# Patient Record
Sex: Female | Born: 1970 | Hispanic: No | Marital: Married | Smoking: Never smoker
Health system: Southern US, Community
[De-identification: ages and names within clinical notes are randomized; demographics above are authoritative.]

## PROBLEM LIST (undated history)

## (undated) HISTORY — PX: FINGER SURGERY: SHX640

---

## 2014-02-12 ENCOUNTER — Encounter (HOSPITAL_COMMUNITY): Payer: Self-pay | Admitting: *Deleted

## 2014-02-12 ENCOUNTER — Emergency Department (HOSPITAL_COMMUNITY)
Admission: EM | Admit: 2014-02-12 | Discharge: 2014-02-13 | Disposition: A | Discharge status: Home / Self Care | Payer: Federal, State, Local not specified - PPO | Attending: Emergency Medicine | Admitting: Emergency Medicine

## 2014-02-12 DIAGNOSIS — K6289 Other specified diseases of anus and rectum: Secondary | ICD-10-CM | POA: Diagnosis present

## 2014-02-12 DIAGNOSIS — Z79899 Other long term (current) drug therapy: Secondary | ICD-10-CM | POA: Insufficient documentation

## 2014-02-12 DIAGNOSIS — R112 Nausea with vomiting, unspecified: Secondary | ICD-10-CM | POA: Diagnosis not present

## 2014-02-12 DIAGNOSIS — K5900 Constipation, unspecified: Secondary | ICD-10-CM | POA: Diagnosis not present

## 2014-02-12 NOTE — ED Notes (Signed)
Pt states that she has taken milk of Magnesia, tea and some over the counter medications to have a BM and has not been successful.

## 2014-02-12 NOTE — ED Notes (Signed)
PT states that she is having rectal pain / pressure; pt states that she feels like she she needs to have a BM but is unable to go; pt last BM was Tues and normal per pt; pt also c/o lower abd cramping

## 2014-02-13 DIAGNOSIS — R112 Nausea with vomiting, unspecified: Secondary | ICD-10-CM | POA: Diagnosis not present

## 2014-02-13 DIAGNOSIS — Z79899 Other long term (current) drug therapy: Secondary | ICD-10-CM | POA: Diagnosis not present

## 2014-02-13 DIAGNOSIS — K5900 Constipation, unspecified: Secondary | ICD-10-CM | POA: Diagnosis not present

## 2014-02-13 DIAGNOSIS — K6289 Other specified diseases of anus and rectum: Secondary | ICD-10-CM | POA: Diagnosis present

## 2014-02-13 MED ORDER — POLYETHYLENE GLYCOL 3350 17 G PO PACK: 17.0000 g | PACK | Freq: Every day | ORAL | Status: AC

## 2014-02-13 MED ORDER — ONDANSETRON 4 MG PO TBDP
4.0000 mg | ORAL_TABLET | Freq: Once | ORAL | Status: AC
  Administered 2014-02-13: 4 mg via ORAL
  Filled 2014-02-13: qty 1

## 2014-02-13 MED ORDER — ONDANSETRON HCL 4 MG PO TABS: 4.0000 mg | ORAL_TABLET | Freq: Four times a day (QID) | ORAL | Status: AC

## 2014-02-13 NOTE — ED Notes (Signed)
Pt reports rectal pain from feeling constipated. Pt reports her rectum is still sore but she recently had a bowel movement.

## 2014-02-13 NOTE — ED Provider Notes (Signed)
CSN: [id number]     Arrival date & time 02/12/14  2312 History   First MD Initiated Contact with Patient 02/13/14 0048     Chief Complaint  Patient presents with  . Rectal Pain     (Consider location/radiation/quality/duration/timing/severity/associated sxs/prior Treatment) Patient is a 44 y.o. female presenting with constipation. The history is provided by the patient. No language interpreter was used.  Constipation Severity:  Moderate Timing:  Constant Associated symptoms: abdominal pain, nausea and vomiting   Associated symptoms: no fever   Associated symptoms comment:  She has not moved her bowels for the past 2 days which is unusual for her normal bowel habit. No rectal bleeding, no vomiting. Prior to arrival she took a laxative, MOM, and attempted an enema. Since arrival she has had a large bowel movement, initially hard followed by non-melanic loose stool, associated with abdominal cramping that is causing nausea with vomiting. No fever. No urinary symptoms. She reports several dietary changes in the recent past that she feels may have contributed to constipation.    History reviewed. No pertinent past medical history. History reviewed. No pertinent past surgical history. No family history on file. History  Substance Use Topics  . Smoking status: Never Smoker   . Smokeless tobacco: Not on file  . Alcohol Use: Yes     Comment: socially   OB History    No data available     Review of Systems  Constitutional: Negative for fever and chills.  HENT: Negative.   Respiratory: Negative.   Cardiovascular: Negative.   Gastrointestinal: Positive for nausea, vomiting, abdominal pain and constipation. Negative for blood in stool.  Musculoskeletal: Negative.   Skin: Negative.   Neurological: Negative.       Allergies  Review of patient's allergies indicates no known allergies.  Home Medications   Prior to Admission medications   Medication Sig Start Date End Date Taking?  Authorizing Provider  carisoprodol (SOMA) 350 MG tablet Take 1 tablet by mouth every 6 (six) hours as needed. Muscle spasm 12/17/13  Yes Historical Provider, MD  magnesium hydroxide (MILK OF MAGNESIA) 400 MG/5ML suspension Take 30 mLs by mouth daily as needed for mild constipation.   Yes Historical Provider, MD  OVER THE COUNTER MEDICATION Take 1 packet by mouth daily as needed (constipation). OTC tea   Yes Historical Provider, MD  phentermine (ADIPEX-P) 37.5 MG tablet Take 37.5 mg by mouth every morning. 01/02/14  Yes Historical Provider, MD  senna (SENOKOT) 8.6 MG tablet Take 2 tablets by mouth daily as needed for constipation.   Yes Historical Provider, MD   BP 131/98 mmHg  Pulse 100  Temp(Src) 98.3 F (36.8 C) (Oral)  Resp 18  SpO2 94%  LMP 02/08/2014 Physical Exam  Constitutional: She is oriented to person, place, and time. She appears well-developed and well-nourished. No distress.  Patient standing, uncomfortable lying.   Cardiovascular: Normal rate.   Pulmonary/Chest: Effort normal.  Abdominal: Soft. There is no tenderness.  Genitourinary:  Rectal exam declined by patient.  Musculoskeletal: Normal range of motion.  Neurological: She is alert and oriented to person, place, and time.  Skin: Skin is warm and dry.  Psychiatric: She has a normal mood and affect.    ED Course  Procedures (including critical care time) Labs Review Labs Reviewed - No data to display  Imaging Review No results found.   EKG Interpretation None      MDM   Final diagnoses:  None    1. Constipation, resolved 2.  Rectal pain  The patient has had multiple bowel movements while in the ED. She feels improved, with periods abdominal cramping that is easing. Abdomen soft, non-tender. Patient has declined further exam and states she is ready for discharge. Discussed regular dietary considerations to avoid constipation in the future.    Arnoldo Hooker, PA-C 02/13/14 1610  Vanetta Mulders,  MD 02/18/14 [phone]

## 2014-02-13 NOTE — Discharge Instructions (Signed)
RECOMMEND DAILY FIBER SUPPLEMENT - ie BENAFIBER, METAMUCIL ZOFRAN FOR NAUSEA AS NEEDED TRY TUCKS MEDICATED PADS AND/OR PREPARATION H FOR RECTAL PAIN ALSO RECOMMEND SITZ BATHS RETURN TO THE ED WITH ANY FEVER, SEVERE PAIN OR NEW CONCERN   Constipation Constipation is when a person:  Poops (has a bowel movement) less than 3 times a week.  Has a hard time pooping.  Has poop that is dry, hard, or bigger than normal. HOME CARE   Eat foods with a lot of fiber in them. This includes fruits, vegetables, beans, and whole grains such as brown rice.  Avoid fatty foods and foods with a lot of sugar. This includes french fries, hamburgers, cookies, candy, and soda.  If you are not getting enough fiber from food, take products with added fiber in them (supplements).  Drink enough fluid to keep your pee (urine) clear or pale yellow.  Exercise on a regular basis, or as told by your doctor.  Go to the restroom when you feel like you need to poop. Do not hold it.  Only take medicine as told by your doctor. Do not take medicines that help you poop (laxatives) without talking to your doctor first. GET HELP RIGHT AWAY IF:   You have bright red blood in your poop (stool).  Your constipation lasts more than 4 days or gets worse.  You have belly (abdominal) or butt (rectal) pain.  You have thin poop (as thin as a pencil).  You lose weight, and it cannot be explained. MAKE SURE YOU:   Understand these instructions.  Will watch your condition.  Will get help right away if you are not doing well or get worse. Document Released: 07/19/2007 Document Revised: 02/04/2013 Document Reviewed: 11/11/2012 [facility] Patient Information 2015 [place], [place]. This information is not intended to replace advice given to you by your health care provider. Make sure you discuss any questions you have with your health care provider.  Anal Fissure, Adult An anal fissure is a small tear or crack in the skin  around the opening of the butt (anus).Bleeding from the tear or crack usually stops on its own within a few minutes. The bleeding may happen every time you poop until the tear or crack heals. HOME CARE  Eat lots of fruit, whole grains, and vegetables. Avoid foods like bananas and dairy products. These foods can make it hard to poop.  Take a warm water bath (sitz bath) as told by your doctor.  Drink enough fluids to keep your pee (urine) clear or pale yellow.  Only take medicines as told by your doctor. Do not take aspirin.  Do not use numbing creams or hydrocortisone cream on the area. These creams can slow healing. GET HELP RIGHT AWAY IF:  Your tear or crack is not healed in 3 days.  You have more bleeding.  You have a fever.  You have watery poop (diarrhea) mixed with blood.  You have pain.  You are getting worse, not better. MAKE SURE YOU:   Understand these instructions.  Will watch your condition.  Will get help right away if you are not doing well or get worse. Document Released: 09/28/2010 Document Revised: 04/24/2011 Document Reviewed: 09/28/2010 [facility] Patient Information 2015 [place], [place]. This information is not intended to replace advice given to you by your health care provider. Make sure you discuss any questions you have with your health care provider.

## 2014-02-24 ENCOUNTER — Encounter: Payer: Federal, State, Local not specified - PPO | Attending: Obstetrics | Admitting: Dietician

## 2014-02-24 ENCOUNTER — Encounter: Payer: Self-pay | Admitting: Dietician

## 2014-02-24 VITALS — Ht 64.5 in | Wt 215.0 lb

## 2014-02-24 DIAGNOSIS — Z6836 Body mass index (BMI) 36.0-36.9, adult: Secondary | ICD-10-CM | POA: Diagnosis not present

## 2014-02-24 DIAGNOSIS — Z713 Dietary counseling and surveillance: Secondary | ICD-10-CM | POA: Diagnosis not present

## 2014-02-24 DIAGNOSIS — E669 Obesity, unspecified: Secondary | ICD-10-CM | POA: Insufficient documentation

## 2014-02-24 NOTE — Progress Notes (Signed)
Medical Nutrition Therapy:  Appt start time: 0840 end time:  0940.   Assessment:  Primary concerns today: Holly Randolph is here today is here today since she is trying to lose weight. Having trouble losing weight and has tried a lot of diets including a vegan diet. Has been exercising and hurt her heel so she has but back on the exercise. Likes to walk and go spinning. Feel frustrating that weight is not coming off. Is aware of her portion sizes. Started taking phentermine about 3-4 weeks and lost 5 lbs. Taking it every other day. Does not feel good while taking it. Has no appetite and has an "achy" feeling.  Has always been "heavier" but not obese and in her 20's had a child and then lost about 30 lbs with a lot of exercise. Gained weight back in her 30's and got to 200 lbs and is now at 215 lbs. Highest weight was 227 lbs in 2014.  Lives with her mother. She does the grocery shopping and hired a Investment banker, operational to do the cooking once per week and preps meals for the week. The chef can cook any cooking style she wants. 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   In a given week will miss 10-12 meals. Not eating out as much as before, about 1-2 x week. Eating once per day lately on phentermine. Only getting 5 hours on phentermine, though will get 7-8 hours not on phentermine.  Has been struggling with constipation and trying to eat more fiber.   Preferred Learning Style:   No preference indicated   Learning Readiness:   Ready  MEDICATIONS: phentermine   DIETARY INTAKE:  Usual eating pattern includes 1-2 meals and 1 snacks per day.  Avoided foods include some seafood, liver, cheese  24-hr recall:  B (9-10 AM): coffee with creamer and sugar Snk ( AM): fruit every once in awhile L (3-4 PM): bagel with cream cheese or soup or raisin bran with almond milk or 1% Snk ( PM): none D (5-6PM): lentil loaf, beef stroganoff, ham [place], risotto with vegetables   Snk ( PM): chips,  dessert Beverages: water, more rarely beer or wine, diet sprite sometimes  Usual physical activity: none right now d/t food injury (plantar fasciitis), planning to get back to exercise  Estimated energy needs: 1800 calories 200 g carbohydrates 135 g protein 50 g fat  Progress Towards Goal(s):  In progress.   Nutritional Diagnosis:  [place]-3.3 Overweight/obesity As related to meal skipping and large portion sizes .  As evidenced by diet recall and BMI of 36.3    Intervention:  Nutrition counseling provided. Plan: Aim to eat 3 meals per day and snacks if you are hungry. Have protein with carbohydrates for snacks.  For breakfast, try having a snack or a protein shake (Premier) with fruit, toast, or another carbohydrate. If you want to do a smoothie, have about 1 cup of fruit with vegetables and protein (powder, plain yogurt, or nuts). To add more fiber, have a lot of vegetables, fruits, whole grain (brown rice, whole wheat pasta and bread, grains), and beans. Try to use smaller plates to help with portions. Measure out snacks. Try to eat meals undistracted and take about 20 minutes to eat (chewing about 20 x bite).  Pay attention to hunger/fullness cues.  Plan to start exercising 2-3 x week at the gym.   Teaching Method Utilized:  Visual Auditory Hands on  Handouts given during visit include:  MyPlate Handout  16X15g CHO Snacks  Barriers to learning/adherence to lifestyle change: unstructured schedule   Demonstrated degree of understanding via:  Teach Back   Monitoring/Evaluation:  Dietary intake, exercise, and body weight in 1 week(s).

## 2014-02-24 NOTE — Patient Instructions (Signed)
Aim to eat 3 meals per day and snacks if you are hungry. Have protein with carbohydrates for snacks.  For breakfast, try having a snack or a protein shake (Premier) with fruit, toast, or another carbohydrate. If you want to do a smoothie, have about 1 cup of fruit with vegetables and protein (powder, plain yogurt, or nuts). To add more fiber, have a lot of vegetables, fruits, whole grain (brown rice, whole wheat pasta and bread, grains), and beans. Try to use smaller plates to help with portions. Measure out snacks. Try to eat meals undistracted and take about 20 minutes to eat (chewing about 20 x bite).  Pay attention to hunger/fullness cues.  Plan to start exercising 2-3 x week at the gym.

## 2014-03-31 ENCOUNTER — Ambulatory Visit: Payer: Federal, State, Local not specified - PPO | Admitting: Dietician

## 2016-10-08 ENCOUNTER — Emergency Department (HOSPITAL_COMMUNITY): Payer: Federal, State, Local not specified - PPO

## 2016-10-08 ENCOUNTER — Encounter (HOSPITAL_COMMUNITY): Payer: Self-pay | Admitting: Emergency Medicine

## 2016-10-08 ENCOUNTER — Emergency Department (HOSPITAL_COMMUNITY)
Admission: EM | Admit: 2016-10-08 | Discharge: 2016-10-08 | Disposition: A | Discharge status: Home / Self Care | Payer: Federal, State, Local not specified - PPO | Attending: Emergency Medicine | Admitting: Emergency Medicine

## 2016-10-08 DIAGNOSIS — R109 Unspecified abdominal pain: Secondary | ICD-10-CM | POA: Diagnosis present

## 2016-10-08 DIAGNOSIS — R300 Dysuria: Secondary | ICD-10-CM | POA: Diagnosis not present

## 2016-10-08 DIAGNOSIS — N201 Calculus of ureter: Secondary | ICD-10-CM | POA: Insufficient documentation

## 2016-10-08 DIAGNOSIS — Z79899 Other long term (current) drug therapy: Secondary | ICD-10-CM | POA: Diagnosis not present

## 2016-10-08 DIAGNOSIS — R350 Frequency of micturition: Secondary | ICD-10-CM | POA: Insufficient documentation

## 2016-10-08 DIAGNOSIS — R319 Hematuria, unspecified: Secondary | ICD-10-CM | POA: Diagnosis not present

## 2016-10-08 LAB — URINALYSIS, ROUTINE W REFLEX MICROSCOPIC
Bilirubin Urine: NEGATIVE
Glucose, UA: NEGATIVE mg/dL
Ketones, ur: NEGATIVE mg/dL
Leukocytes, UA: NEGATIVE
Nitrite: NEGATIVE
Protein, ur: NEGATIVE mg/dL
SPECIFIC GRAVITY, URINE: 1.02 (ref 1.005–1.030)
pH: 5 (ref 5.0–8.0)

## 2016-10-08 LAB — BASIC METABOLIC PANEL
ANION GAP: 5 (ref 5–15)
BUN: 10 mg/dL (ref 6–20)
CO2: 24 mmol/L (ref 22–32)
Calcium: 8.8 mg/dL — ABNORMAL LOW (ref 8.9–10.3)
Chloride: 106 mmol/L (ref 101–111)
Creatinine, Ser: 1.05 mg/dL — ABNORMAL HIGH (ref 0.44–1.00)
GFR calc Af Amer: >60 mL/min (ref >60)
GFR calc non Af Amer: >60 mL/min (ref >60)
GLUCOSE: 141 mg/dL — AB (ref 65–99)
POTASSIUM: 4.2 mmol/L (ref 3.5–5.1)
Sodium: 135 mmol/L (ref 135–145)

## 2016-10-08 LAB — CBC
HEMATOCRIT: 42.9 % (ref 36.0–46.0)
HEMOGLOBIN: 15.3 g/dL — AB (ref 12.0–15.0)
MCH: 31.9 pg (ref 26.0–34.0)
MCHC: 35.7 g/dL (ref 30.0–36.0)
MCV: 89.6 fL (ref 78.0–100.0)
Platelets: 255 10*3/uL (ref 150–400)
RBC: 4.79 MIL/uL (ref 3.87–5.11)
RDW: 12.5 % (ref 11.5–15.5)
WBC: 7 10*3/uL (ref 4.0–10.5)

## 2016-10-08 LAB — PREGNANCY, URINE: Preg Test, Ur: NEGATIVE

## 2016-10-08 MED ORDER — ONDANSETRON 4 MG PO TBDP: 4.0000 mg | ORAL_TABLET | Freq: Three times a day (TID) | ORAL | 0 refills | Status: AC | PRN

## 2016-10-08 MED ORDER — TAMSULOSIN HCL 0.4 MG PO CAPS: 0.4000 mg | ORAL_CAPSULE | Freq: Every day | ORAL | 0 refills | Status: AC

## 2016-10-08 MED ORDER — HYDROMORPHONE HCL 1 MG/ML IJ SOLN
1.0000 mg | Freq: Once | INTRAMUSCULAR | Status: AC
  Administered 2016-10-08: 1 mg via INTRAVENOUS
  Filled 2016-10-08: qty 1

## 2016-10-08 MED ORDER — KETOROLAC TROMETHAMINE 30 MG/ML IJ SOLN
30.0000 mg | Freq: Once | INTRAMUSCULAR | Status: AC
  Administered 2016-10-08: 30 mg via INTRAVENOUS
  Filled 2016-10-08: qty 1

## 2016-10-08 MED ORDER — ONDANSETRON HCL 4 MG/2ML IJ SOLN
4.0000 mg | Freq: Once | INTRAMUSCULAR | Status: AC
  Administered 2016-10-08: 4 mg via INTRAVENOUS
  Filled 2016-10-08: qty 2

## 2016-10-08 MED ORDER — OXYCODONE-ACETAMINOPHEN 5-325 MG PO TABS: 1.0000 | ORAL_TABLET | ORAL | 0 refills | Status: AC | PRN

## 2016-10-08 MED ORDER — HYDROMORPHONE HCL 1 MG/ML IJ SOLN
1.0000 mg | Freq: Once | INTRAMUSCULAR | Status: AC
Start: 2016-10-08 — End: 2016-10-08
  Administered 2016-10-08: 1 mg via INTRAVENOUS
  Filled 2016-10-08: qty 1

## 2016-10-08 NOTE — ED Provider Notes (Signed)
[facility]-EMERGENCY DEPT Provider Note   CSN: [id number] Arrival date & time: 10/08/16  0607     History   Chief Complaint Chief Complaint  Patient presents with  . Flank Pain    HPI Holly Randolph is a 46 y.o. female.  The history is provided by the patient and medical records.  Flank Pain     46 y.o. F with no significant PMH presenting to the ED for right flank pain.  States she got up this morning around 0430 am to go to the bathroom and had sudden onset of right flank pain.  States pain is sharp, radiating along right lateral abdomen.  She reports some pain with urination and frequency.  States she feels incomplete bladder emptying.  No nausea/vomiting.  No fever, chills.  No history of kidney stones.    History reviewed. No pertinent past medical history.  There are no active problems to display for this patient.   Past Surgical History:  Procedure Laterality Date  . FINGER SURGERY      OB History    No data available       Home Medications    Prior to Admission medications   Medication Sig Start Date End Date Taking? Authorizing Provider  carisoprodol (SOMA) 350 MG tablet Take 1 tablet by mouth every 6 (six) hours as needed. Muscle spasm 12/17/13   [provider]  ergocalciferol (VITAMIN D2) 50000 UNITS capsule Take 50,000 Units by mouth once a week.    [provider]  magnesium hydroxide (MILK OF MAGNESIA) 400 MG/5ML suspension Take 30 mLs by mouth daily as needed for mild constipation.    [provider]  ondansetron (ZOFRAN) 4 MG tablet Take 1 tablet (4 mg total) by mouth every 6 (six) hours. Patient not taking: Reported on 02/24/2014 02/13/14   Elpidio Anis, PA-C  OVER THE COUNTER MEDICATION Take 1 packet by mouth daily as needed (constipation). OTC tea    [provider]  phentermine (ADIPEX-P) 37.5 MG tablet Take 37.5 mg by mouth every morning. 01/02/14   [provider]  polyethylene glycol (MIRALAX) packet Take  17 g by mouth daily. 02/13/14   Elpidio Anis, PA-C  senna (SENOKOT) 8.6 MG tablet Take 2 tablets by mouth daily as needed for constipation.    [provider]    Family History No family history on file.  Social History Social History  Substance Use Topics  . Smoking status: Never Smoker  . Smokeless tobacco: Never Used  . Alcohol use Yes     Comment: socially     Allergies   Patient has no known allergies.   Review of Systems Review of Systems  Genitourinary: Positive for difficulty urinating, flank pain and frequency.  All other systems reviewed and are negative.    Physical Exam Updated Vital Signs BP 138/87 (BP Location: Right Arm)   Pulse 79   Temp 98.1 F (36.7 C) (Oral)   Resp 18   Ht 5' 4.5" (1.638 m)   Wt 90.7 kg (200 lb)   SpO2 100%   BMI 33.80 kg/m   Physical Exam  Constitutional: She is oriented to person, place, and time. She appears well-developed and well-nourished.  Uncomfortable appearing, packing around room, intermittently bending over bed, face is clammy  HENT:  Head: Normocephalic and atraumatic.  Mouth/Throat: Oropharynx is clear and moist.  Eyes: Pupils are equal, round, and reactive to light. Conjunctivae and EOM are normal.  Neck: Normal range of motion.  Cardiovascular: Normal rate,  regular rhythm and normal heart sounds.   Pulmonary/Chest: Effort normal and breath sounds normal. No respiratory distress. She has no wheezes.  Abdominal: Soft. Bowel sounds are normal. There is no tenderness. There is CVA tenderness. There is no rebound.  Right CVA tenderness  Musculoskeletal: Normal range of motion.  Neurological: She is alert and oriented to person, place, and time.  Skin: Skin is warm and dry.  Psychiatric: She has a normal mood and affect.  Nursing note and vitals reviewed.    ED Treatments / Results  Labs (all labs ordered are listed, but only abnormal results are displayed) Labs Reviewed  URINALYSIS, ROUTINE W  REFLEX MICROSCOPIC - Abnormal; Notable for the following:       Result Value   APPearance HAZY (*)    Hgb urine dipstick SMALL (*)    Bacteria, UA RARE (*)    Squamous Epithelial / LPF 0-5 (*)    All other components within normal limits  CBC - Abnormal; Notable for the following:    Hemoglobin 15.3 (*)    All other components within normal limits  BASIC METABOLIC PANEL    EKG  EKG Interpretation None       Radiology Ct Renal Stone Study  Result Date: 10/08/2016 CLINICAL DATA:  Acute onset right flank pain radiating to groin beginning this morning. EXAM: CT ABDOMEN AND PELVIS WITHOUT CONTRAST TECHNIQUE: Multidetector CT imaging of the abdomen and pelvis was performed following the standard protocol without IV contrast. COMPARISON:  None. FINDINGS: Lower chest: No acute findings. Hepatobiliary:  No masses visualized on this unenhanced exam. Pancreas: No mass or inflammatory process visualized on this unenhanced exam. Spleen:  Within normal limits in size. Adrenals/Urinary tract: Mild to moderate right hydroureteronephrosis is seen. A 3 mm calculus is seen at the right ureterovesical junction. Stomach/Bowel: Small hiatal hernia. No evidence of obstruction, inflammatory process, or abnormal fluid collections. Vascular/Lymphatic: No pathologically enlarged lymph nodes identified. No evidence of abdominal aortic aneurysm. Reproductive:  No mass or other significant abnormality. Other:  None. Musculoskeletal:  No suspicious bone lesions identified. IMPRESSION: Mild to moderate right hydroureteronephrosis due to 3 mm calculus at the right ureterovesical junction. Small hiatal hernia. Electronically Signed   By: Myles Rosenthal M.D.   On: 10/08/2016 09:28    Procedures Procedures (including critical care time)  Medications Ordered in ED Medications  HYDROmorphone (DILAUDID) injection 1 mg (1 mg Intravenous Given 10/08/16 0745)  ondansetron (ZOFRAN) injection 4 mg (4 mg Intravenous Given 10/08/16  0745)  HYDROmorphone (DILAUDID) injection 1 mg (1 mg Intravenous Given 10/08/16 1018)  ketorolac (TORADOL) 30 MG/ML injection 30 mg (30 mg Intravenous Given 10/08/16 1017)     Initial Impression / Assessment and Plan / ED Course  I have reviewed the triage vital signs and the nursing notes.  Pertinent labs & imaging results that were available during my care of the patient were reviewed by me and considered in my medical decision making (see chart for details).  46 year old female here with sudden onset right flank pain at 4:30 this morning. She is afebrile and nontoxic. She is pacing around the room and appears very uncomfortable. Right CVA tenderness noted. Concern for kidney stone. Patient given IV medications. Labs pending. CT renal study will be obtained.  9:50 AM Patient now up and pacing again.  Pain returning.  Discussed CT results--20mm stone at right UVJ.  Will re-dose medications, add toradol.  Will reassess.  10:33 AM Patient resting comfortably now.  States she is feeling  better.  Appears more comfortable.  VSS.  appropraite for discharge.  Will give urology follow-up, prescriptions for symptomatic care at home. Urine strainer to monitor for passage of stone.  Discussed plan with patient, she acknowledged understanding and agreed with plan of care.  Return precautions given for new or worsening symptoms.  Final Clinical Impressions(s) / ED Diagnoses   Final diagnoses:  Right ureteral stone  Flank pain  Hematuria, unspecified type    New Prescriptions Discharge Medication List as of 10/08/2016 10:35 AM    START taking these medications   Details  ondansetron (ZOFRAN ODT) 4 MG disintegrating tablet Take 1 tablet (4 mg total) by mouth every 8 (eight) hours as needed for nausea., Starting Sun 10/08/2016, Print    oxyCODONE-acetaminophen (PERCOCET) 5-325 MG tablet Take 1 tablet by mouth every 4 (four) hours as needed., Starting Sun 10/08/2016, Print    tamsulosin (FLOMAX) 0.4 MG  CAPS capsule Take 1 capsule (0.4 mg total) by mouth daily after supper., Starting Sun 10/08/2016, Print         Garlon Hatchet, PA-C 10/08/16 1106    Ward, Layla Maw, DO 10/08/16 2314

## 2016-10-08 NOTE — ED Notes (Signed)
Pt tolerating sip of soda

## 2016-10-08 NOTE — Discharge Instructions (Signed)
Take the prescribed medication as directed.  Make sure to drink plenty of water, try to limit soda, coffee, and tea. Recommend to strain the urine to monitor for passage of stone. Follow-up with [facility] urology if any ongoing issues-- call the morning to make an appointment. Return to the ED for new or worsening symptoms.

## 2016-10-08 NOTE — ED Notes (Signed)
Pt ambulatory to restroom

## 2016-10-08 NOTE — ED Triage Notes (Signed)
Reports waking with severe right flank pain and urinary frequency.  Notably uncomfortable in triage.

## 2016-10-08 NOTE — ED Notes (Signed)
ED Provider at bedside. 

## 2018-01-21 IMAGING — CT CT RENAL STONE PROTOCOL
2 of 4 series · 16 of 46 positions shown, 18 images · non-contrast
Comparison: None.

CLINICAL DATA: Acute onset right flank pain radiating to groin
beginning this morning.

EXAM:
CT ABDOMEN AND PELVIS WITHOUT CONTRAST
TECHNIQUE: Multidetector CT imaging of the abdomen and pelvis was performed
following the standard protocol without IV contrast.

[Series 3: ap without · axial · non-contrast · 0.66mm/px · z∈[+1003,+1408]mm · 13 of 91 slices shown, 15 images]
[im 5/91  soft-tissue]
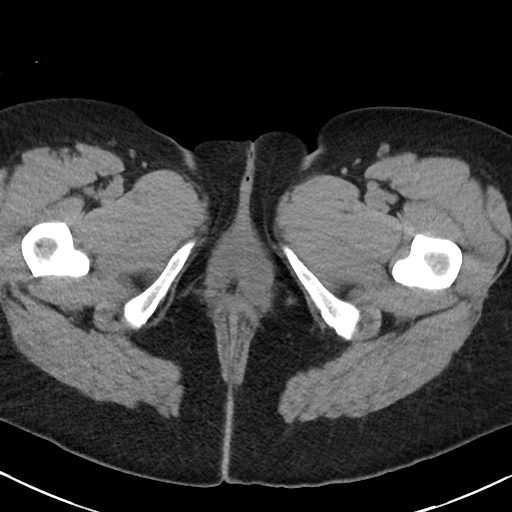
[im 5/91  bone]
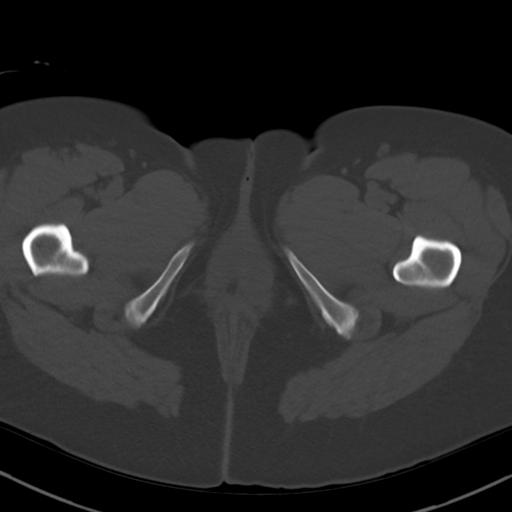
[im 15/91  soft-tissue]
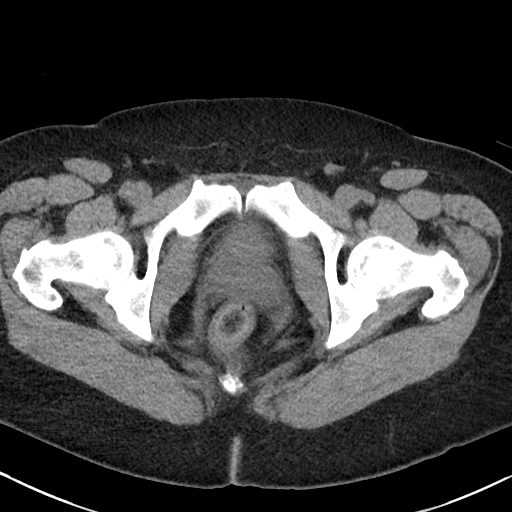
[im 19/91  soft-tissue]
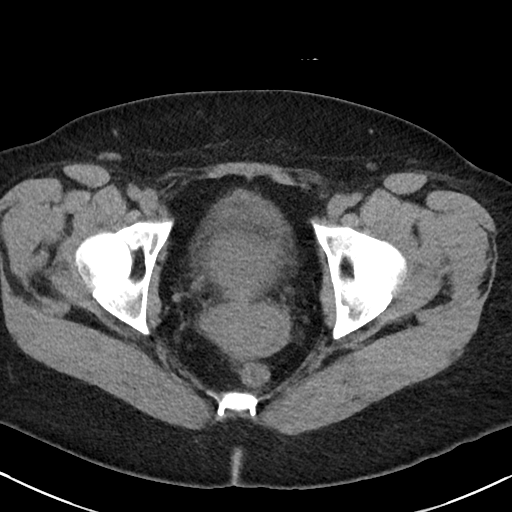
[im 24/91  soft-tissue]
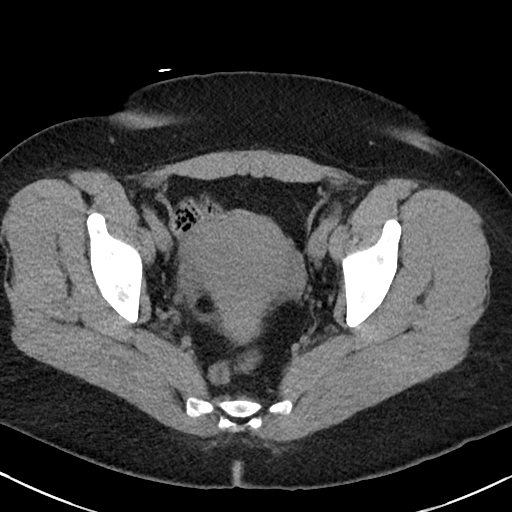
[im 34/91  soft-tissue]
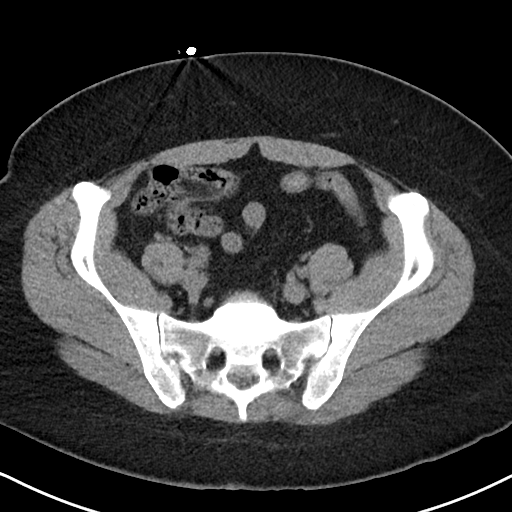
[im 38/91  soft-tissue]
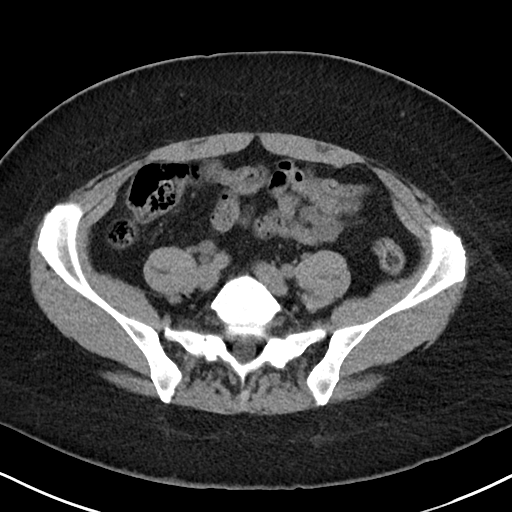
[im 48/91  soft-tissue]
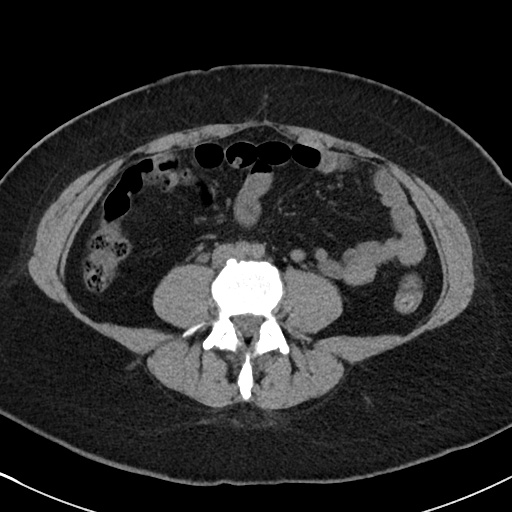
[im 53/91  soft-tissue]
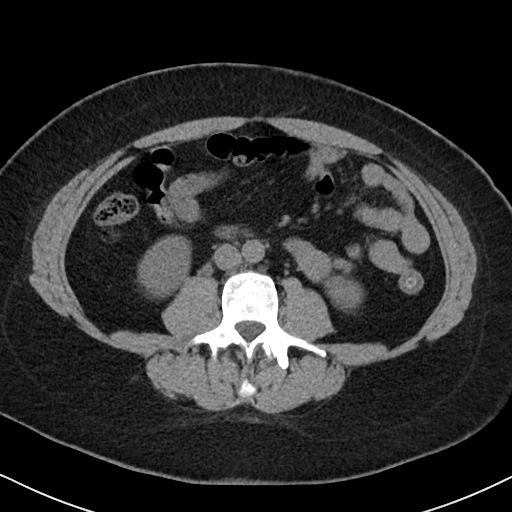
[im 57/91  soft-tissue]
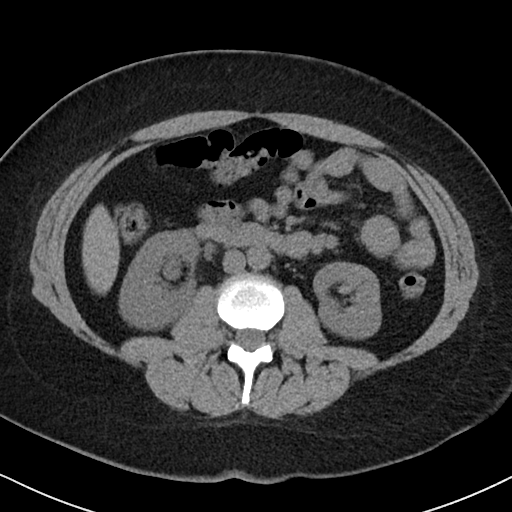
[im 57/91  bone]
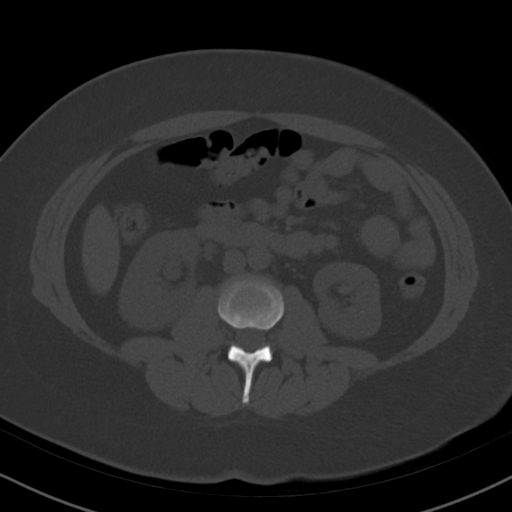
[im 67/91  soft-tissue]
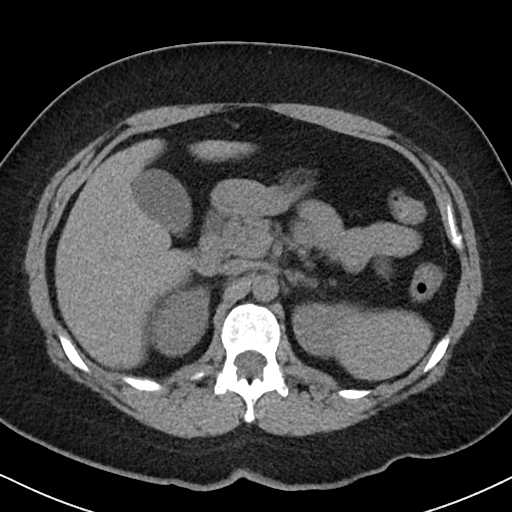
[im 72/91  soft-tissue]
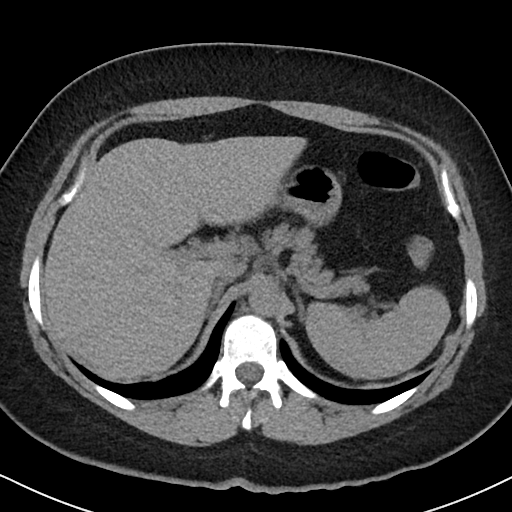
[im 76/91  soft-tissue]
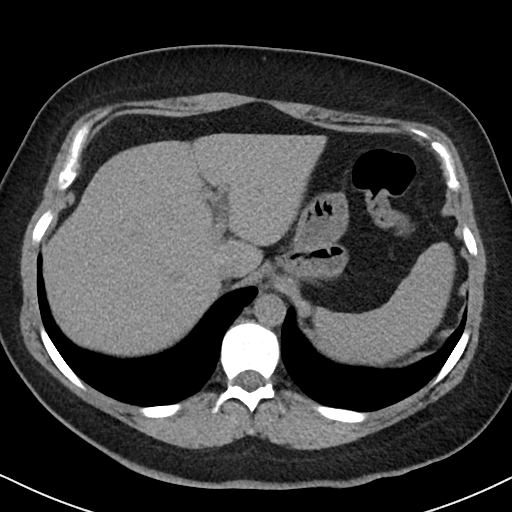
[im 86/91  soft-tissue]
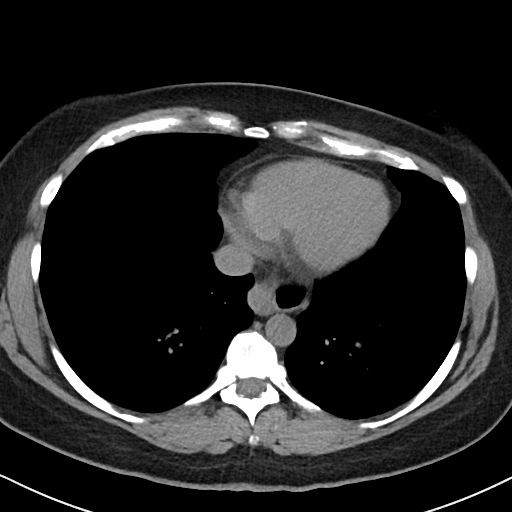

[Series 6: cor · coronal · 0.69mm/px · 3 of 82 slices shown]
[im 28/82  soft-tissue]
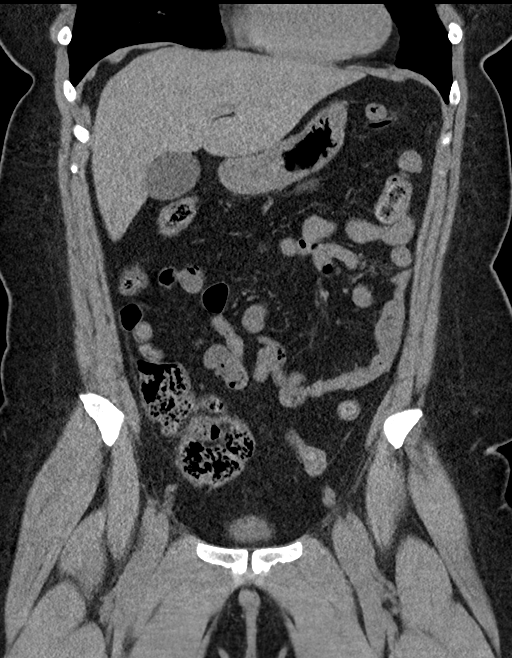
[im 37/82  soft-tissue]
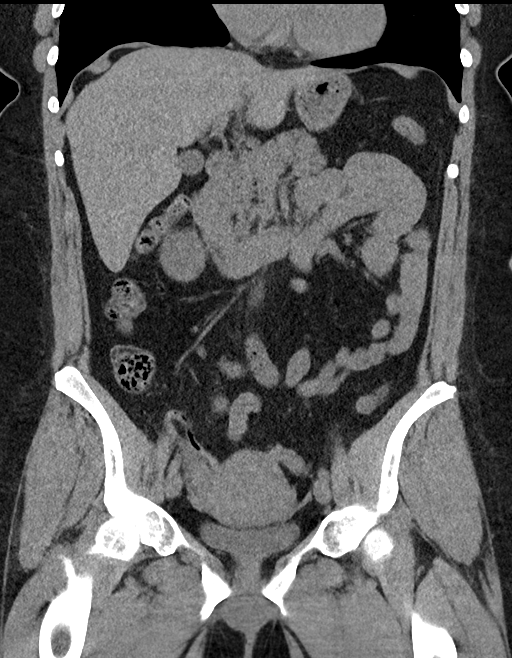
[im 46/82  soft-tissue]
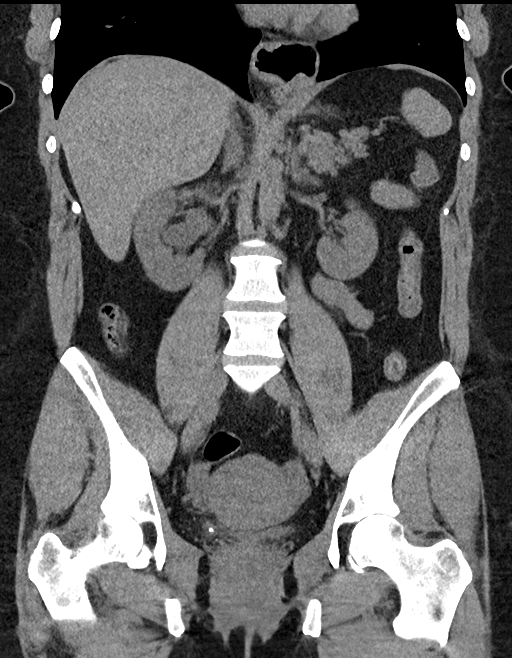

[16 of 46 positions shown; findings below may reference images not displayed]

FINDINGS: Lower chest: No acute findings.

Hepatobiliary:  No masses visualized on this unenhanced exam.

Pancreas: No mass or inflammatory process visualized on this
unenhanced exam.

Spleen:  Within normal limits in size.

Adrenals/Urinary tract: Mild to moderate right hydroureteronephrosis
is seen. A 3 mm calculus is seen at the right ureterovesical
junction.

Stomach/Bowel: Small hiatal hernia. No evidence of obstruction,
inflammatory process, or abnormal fluid collections.

Vascular/Lymphatic: No pathologically enlarged lymph nodes
identified. No evidence of abdominal aortic aneurysm.

Reproductive:  No mass or other significant abnormality.

Other:  None.

Musculoskeletal:  No suspicious bone lesions identified.
IMPRESSION: Mild to moderate right hydroureteronephrosis due to 3 mm calculus at
the right ureterovesical junction.

Small hiatal hernia.

## 2019-12-03 ENCOUNTER — Telehealth: Payer: Self-pay | Admitting: Obstetrics

## 2022-05-27 ENCOUNTER — Other Ambulatory Visit (HOSPITAL_BASED_OUTPATIENT_CLINIC_OR_DEPARTMENT_OTHER): Payer: Self-pay

## 2022-05-27 MED ORDER — WEGOVY 0.25 MG/0.5ML ~~LOC~~ SOAJ
0.2500 mg | SUBCUTANEOUS | 0 refills | Status: AC
  Filled 2022-05-27 – 2022-06-29 (×2): qty 2, 28d supply, fill #0

## 2022-06-29 ENCOUNTER — Other Ambulatory Visit (HOSPITAL_BASED_OUTPATIENT_CLINIC_OR_DEPARTMENT_OTHER): Payer: Self-pay

## 2023-02-20 ENCOUNTER — Other Ambulatory Visit: Payer: Self-pay
